# Patient Record
Sex: Female | Born: 2000 | Race: White | Hispanic: No | Marital: Single | Smoking: Never smoker
Health system: Southern US, Community
[De-identification: ages and names within clinical notes are randomized; demographics above are authoritative.]

## PROBLEM LIST (undated history)

## (undated) DIAGNOSIS — G43909 Migraine, unspecified, not intractable, without status migrainosus: Secondary | ICD-10-CM

## (undated) DIAGNOSIS — L709 Acne, unspecified: Secondary | ICD-10-CM

## (undated) DIAGNOSIS — S060X9A Concussion with loss of consciousness of unspecified duration, initial encounter: Secondary | ICD-10-CM

## (undated) DIAGNOSIS — J45909 Unspecified asthma, uncomplicated: Secondary | ICD-10-CM

## (undated) HISTORY — DX: Acne, unspecified: L70.9

## (undated) HISTORY — DX: Migraine, unspecified, not intractable, without status migrainosus: G43.909

## (undated) HISTORY — DX: Unspecified asthma, uncomplicated: J45.909

## (undated) HISTORY — DX: Concussion with loss of consciousness of unspecified duration, initial encounter: S06.0X9A

---
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
## 2018-02-12 ENCOUNTER — Ambulatory Visit: Payer: 59 | Admitting: Family Medicine

## 2018-02-14 ENCOUNTER — Ambulatory Visit: Payer: 59 | Admitting: Family Medicine

## 2018-07-01 ENCOUNTER — Encounter: Payer: Self-pay | Admitting: Family Medicine

## 2018-07-01 ENCOUNTER — Other Ambulatory Visit: Payer: Self-pay | Admitting: Family Medicine

## 2018-07-01 MED ORDER — NORGESTIM-ETH ESTRAD TRIPHASIC 0.18/0.215/0.25 MG-35 MCG PO TABS: 1.0000 | ORAL_TABLET | Freq: Every day | ORAL | 3 refills | Status: DC

## 2018-10-23 ENCOUNTER — Encounter: Payer: Self-pay | Admitting: Family Medicine

## 2018-10-23 ENCOUNTER — Other Ambulatory Visit: Payer: Self-pay | Admitting: Family Medicine

## 2018-10-23 NOTE — Telephone Encounter (Signed)
Requested medication (s) are due for refill today: Yes  Requested medication (s) are on the active medication list: Yes  Last refill:  07/01/18  Future visit scheduled: No  Notes to clinic:  Left message for pt. To call and make an appointment.    Requested Prescriptions  Pending Prescriptions Disp Refills   TRI-SPRINTEC 0.18/0.215/0.25 MG-35 MCG tablet [Pharmacy Med Name: Tri-Sprintec 0.18/0.215/0.25 MG-35 MCG Oral Tablet] 1 Package 0    Sig: Take 1 tablet by mouth once daily     OB/GYN:  Contraceptives Failed - 10/23/2018  9:17 AM      Failed - Valid encounter within last 12 months    Recent Outpatient Visits          1 year ago Routine general medical examination at a health care facility   [facility], [place] P, DO   1 year ago Moderate single current episode of major depressive disorder [facility])   [facility] Johnson, Megan P, DO   1 year ago Moderate single current episode of major depressive disorder [facility])   [facility] [place], [place], DO   1 year ago Itching   [facility] [place], [place], DO   1 year ago Urticaria   [facility] [place], [place]             Passed - Last BP in normal range    BP Readings from Last 1 Encounters:  09/17/17 111/75

## 2018-10-24 ENCOUNTER — Other Ambulatory Visit: Payer: Self-pay

## 2018-10-24 ENCOUNTER — Encounter: Payer: Self-pay | Admitting: Family Medicine

## 2018-10-24 ENCOUNTER — Ambulatory Visit (INDEPENDENT_AMBULATORY_CARE_PROVIDER_SITE_OTHER): Payer: BC Managed Care – PPO | Admitting: Family Medicine

## 2018-10-24 VITALS — BP 120/60 | Temp 97.6°F | Wt 153.2 lb

## 2018-10-24 DIAGNOSIS — F321 Major depressive disorder, single episode, moderate: Secondary | ICD-10-CM | POA: Diagnosis not present

## 2018-10-24 DIAGNOSIS — Z3041 Encounter for surveillance of contraceptive pills: Secondary | ICD-10-CM

## 2018-10-24 DIAGNOSIS — G43719 Chronic migraine without aura, intractable, without status migrainosus: Secondary | ICD-10-CM | POA: Diagnosis not present

## 2018-10-24 MED ORDER — NORGESTIM-ETH ESTRAD TRIPHASIC 0.18/0.215/0.25 MG-35 MCG PO TABS: 1.0000 | ORAL_TABLET | Freq: Every day | ORAL | 3 refills | Status: DC

## 2018-10-24 MED ORDER — NORTRIPTYLINE HCL 75 MG PO CAPS: 75.0000 mg | ORAL_CAPSULE | Freq: Every day | ORAL | 3 refills | Status: DC

## 2018-10-24 NOTE — Assessment & Plan Note (Signed)
Will restart her nortriptyline. Call with any concerns. Continue to monitor. Refills given today.

## 2018-10-24 NOTE — Assessment & Plan Note (Signed)
Stable off medicine. Doing counseling. Continue counseling. Continue to monitor.

## 2018-10-24 NOTE — Progress Notes (Signed)
BP 120/60   Temp 97.6 F (36.4 C)   Wt 153 lb 4 oz (69.5 kg)   BMI 22.63 kg/m    Subjective:    Patient ID: Morgan Bush, female    DOB: 04-16-2001, 18 y.o.   MRN: [id number]  HPI: Morgan Bush is a 18 y.o. female  Chief Complaint  Patient presents with  . Contraception   Has been doing better. Mental health is doing better. Has come off her lexapro. Was doing counseling. Has been doing a lot better. Does not want to get back on her medicine. Doing well without it Depression screen [facility] 2/9 10/24/2018 09/17/2017 06/01/2017 04/16/2017 07/06/2016  Decreased Interest 0 2 2 1 1   Down, Depressed, Hopeless 1 2 3 2 2   PHQ - 2 Score 1 4 5 3 3   Altered sleeping 0 1 0 3 3  Tired, decreased energy 3 1 0 2 2  Change in appetite 0 1 0 1 2  Feeling bad or failure about yourself  0 2 2 2 1   Trouble concentrating 3 3 2 3 1   Moving slowly or fidgety/restless 3 1 2 1 1   Suicidal thoughts 0 1 2 1 1   PHQ-9 Score 10 14 13 16 14   Difficult doing work/chores Very difficult - - Very difficult -   CONTRACEPTION CONCERNS Contraception: OCP Previous contraception: OCP  Sexual activity: practicing safe sex Average interval between menses: 28 days  Length of menses:  5-7 days Flow: moderate Dysmenorrhea: yes  Has been having more headaches than usual right now. She associates this with stress. Has not been taking her nortriptyline. Would like to go back on it. No other concerns or complaints at this time.  Relevant past medical, surgical, family and social history reviewed and updated as indicated. Interim medical history since our last visit reviewed. Allergies and medications reviewed and updated.  Review of Systems  Constitutional: Negative.   Respiratory: Negative.   Cardiovascular: Negative.   Musculoskeletal: Negative.   Neurological: Positive for headaches. Negative for dizziness, tremors, seizures, syncope, facial asymmetry, speech difficulty, weakness, light-headedness and  numbness.  Hematological: Negative.   Psychiatric/Behavioral: Negative.     Per HPI unless specifically indicated above     Objective:    BP 120/60   Temp 97.6 F (36.4 C)   Wt 153 lb 4 oz (69.5 kg)   BMI 22.63 kg/m   Wt Readings from Last 3 Encounters:  10/24/18 153 lb 4 oz (69.5 kg)  09/17/17 160 lb (72.6 kg) (88 %, Z= 1.18)*  06/01/17 157 lb 6 oz (71.4 kg) (87 %, Z= 1.14)*   * Growth percentiles are based on CDC (Girls, 2-20 Years) data.    Physical Exam Vitals signs and nursing note reviewed.  Constitutional:      General: She is not in acute distress.    Appearance: Normal appearance. She is not ill-appearing, toxic-appearing or diaphoretic.  HENT:     Head: Normocephalic and atraumatic.     Right Ear: External ear normal.     Left Ear: External ear normal.     Nose: Nose normal.     Mouth/Throat:     Mouth: Mucous membranes are moist.     Pharynx: Oropharynx is clear.  Eyes:     General: No scleral icterus.       Right eye: No discharge.        Left eye: No discharge.     Conjunctiva/sclera: Conjunctivae normal.     Pupils: Pupils  are equal, round, and reactive to light.  Neck:     Musculoskeletal: Normal range of motion.  Pulmonary:     Effort: Pulmonary effort is normal. No respiratory distress.     Comments: Speaking in full sentences Musculoskeletal: Normal range of motion.  Skin:    Coloration: Skin is not jaundiced or pale.     Findings: No bruising, erythema, lesion or rash.  Neurological:     Mental Status: She is alert and oriented to person, place, and time. Mental status is at baseline.  Psychiatric:        Mood and Affect: Mood normal.        Behavior: Behavior normal.        Thought Content: Thought content normal.        Judgment: Judgment normal.     Results for orders placed or performed in visit on 09/17/17  GC/Chlamydia Probe Amp  Result Value Ref Range   Chlamydia trachomatis, NAA Negative Negative   Neisseria gonorrhoeae by  PCR Negative Negative  Microscopic Examination  Result Value Ref Range   WBC, UA 6-10 (A) 0 - 5 /hpf   RBC, UA 0-2 0 - 2 /hpf   Epithelial Cells (non renal) 0-10 0 - 10 /hpf   Renal Epithel, UA 0-10 (A) None seen /hpf   Bacteria, UA Moderate (A) None seen/Few  Urine Culture, Reflex  Result Value Ref Range   Urine Culture, Routine Final report (A)    Organism ID, Bacteria Escherichia coli (A)    Antimicrobial Susceptibility Comment   CBC with Differential/Platelet  Result Value Ref Range   WBC 4.5 3.4 - 10.8 x10E3/uL   RBC 4.46 3.77 - 5.28 x10E6/uL   Hemoglobin 13.5 11.1 - 15.9 g/dL   Hematocrit 40.9 81.1 - 46.6 %   MCV 85 79 - 97 fL   MCH 30.3 26.6 - 33.0 pg   MCHC 35.5 31.5 - 35.7 g/dL   RDW 91.4 78.2 - 95.6 %   Platelets 271 150 - 379 x10E3/uL   Neutrophils 42 Not Estab. %   Lymphs 44 Not Estab. %   Monocytes 11 Not Estab. %   Eos 2 Not Estab. %   Basos 1 Not Estab. %   Neutrophils Absolute 1.9 1.4 - 7.0 x10E3/uL   Lymphocytes Absolute 2.0 0.7 - 3.1 x10E3/uL   Monocytes Absolute 0.5 0.1 - 0.9 x10E3/uL   EOS (ABSOLUTE) 0.1 0.0 - 0.4 x10E3/uL   Basophils Absolute 0.0 0.0 - 0.2 x10E3/uL   Immature Granulocytes 0 Not Estab. %   Immature Grans (Abs) 0.0 0.0 - 0.1 x10E3/uL  Comprehensive metabolic panel  Result Value Ref Range   Glucose 77 65 - 99 mg/dL   BUN 9 6 - 20 mg/dL   Creatinine, Ser 2.13 0.57 - 1.00 mg/dL   GFR calc non Af Amer 124 >59 mL/min/1.73   GFR calc Af Amer 143 >59 mL/min/1.73   BUN/Creatinine Ratio 13 9 - 23   Sodium 140 134 - 144 mmol/L   Potassium 4.1 3.5 - 5.2 mmol/L   Chloride 103 96 - 106 mmol/L   CO2 21 20 - 29 mmol/L   Calcium 8.8 8.7 - 10.2 mg/dL   Total Protein 6.8 6.0 - 8.5 g/dL   Albumin 4.1 3.5 - 5.5 g/dL   Globulin, Total 2.7 1.5 - 4.5 g/dL   Albumin/Globulin Ratio 1.5 1.2 - 2.2   Bilirubin Total 0.3 0.0 - 1.2 mg/dL   Alkaline Phosphatase 51 39 - 117 IU/L  AST 16 0 - 40 IU/L   ALT 22 0 - 32 IU/L  Lipid Panel w/o Chol/HDL Ratio   Result Value Ref Range   Cholesterol, Total 181 (H) 100 - 169 mg/dL   Triglycerides 454123 (H) 0 - 89 mg/dL   HDL 70 >09>39 mg/dL   VLDL Cholesterol Cal 25 5 - 40 mg/dL   LDL Calculated 86 0 - 109 mg/dL  TSH  Result Value Ref Range   TSH 5.270 (H) 0.450 - 4.500 uIU/mL  UA/M w/rflx Culture, Routine  Result Value Ref Range   Specific Gravity, UA 1.020 1.005 - 1.030   pH, UA 6.0 5.0 - 7.5   Color, UA Yellow Yellow   Appearance Ur Turbid (A) Clear   Leukocytes, UA Trace (A) Negative   Protein, UA Negative Negative/Trace   Glucose, UA Negative Negative   Ketones, UA Negative Negative   RBC, UA 3+ (A) Negative   Bilirubin, UA Negative Negative   Urobilinogen, Ur 0.2 0.2 - 1.0 mg/dL   Nitrite, UA Negative Negative   Microscopic Examination See below:    Urinalysis Reflex Comment   RPR  Result Value Ref Range   RPR Ser Ql Non Reactive Non Reactive  HIV antibody  Result Value Ref Range   HIV Screen 4th Generation wRfx Non Reactive Non Reactive  Hepatitis, Acute  Result Value Ref Range   Hep A IgM Negative Negative   Hepatitis B Surface Ag Negative Negative   Hep B C IgM Negative Negative   Hep C Virus Ab <0.1 0.0 - 0.9 s/co ratio  HSV(herpes simplex vrs) 1+2 ab-IgG  Result Value Ref Range   HSV 1 Glycoprotein G Ab, IgG <0.91 0.00 - 0.90 index   HSV 2 IgG, Type Spec <0.91 0.00 - 0.90 index      Assessment & Plan:   Problem List Items Addressed This Visit      Cardiovascular and Mediastinum   Intractable chronic migraine without aura and without status migrainosus    Will restart her nortriptyline. Call with any concerns. Continue to monitor. Refills given today.      Relevant Medications   nortriptyline (PAMELOR) 75 MG capsule     Other   Moderate single current episode of major depressive disorder (HCC) - Primary    Stable off medicine. Doing counseling. Continue counseling. Continue to monitor.       Relevant Medications   nortriptyline (PAMELOR) 75 MG capsule     Other Visit Diagnoses    Encounter for surveillance of contraceptive pills       Stable. Will continue current regimen. Continue to monitor. Call with any concerns.        Follow up plan: Return End of July, Beginning of August, for Physical.    . This visit was completed via Skype due to the restrictions of the COVID-19 pandemic. All issues as above were discussed and addressed. Physical exam was done as above through visual confirmation on Skype. If it was felt that the patient should be evaluated in the office, they were directed there. The patient verbally consented to this visit. . Location of the patient: home . Location of the provider: home . Those involved with this call:  . Provider: Olevia PerchesMegan Mega Kinkade, DO . CMA: Tiffany Reel, CMA . Front Desk/Registration: Adela Portshristan Williamson  . Time spent on call: 25 minutes with patient face to face via video conference. More than 50% of this time was spent in counseling and coordination of care. 40 minutes total spent  in review of patient's record and preparation of their chart.

## 2019-01-23 ENCOUNTER — Encounter: Payer: Self-pay | Admitting: Family Medicine

## 2019-01-23 ENCOUNTER — Other Ambulatory Visit: Payer: Self-pay

## 2019-01-23 ENCOUNTER — Ambulatory Visit (INDEPENDENT_AMBULATORY_CARE_PROVIDER_SITE_OTHER): Payer: BC Managed Care – PPO | Admitting: Family Medicine

## 2019-01-23 VITALS — BP 117/79 | HR 69 | Temp 98.4°F | Ht 68.5 in | Wt 151.0 lb

## 2019-01-23 DIAGNOSIS — Z0000 Encounter for general adult medical examination without abnormal findings: Secondary | ICD-10-CM | POA: Diagnosis not present

## 2019-01-23 NOTE — Patient Instructions (Signed)
Health Maintenance, Female Adopting a healthy lifestyle and getting preventive care are important in promoting health and wellness. Ask your health care provider about:  The right schedule for you to have regular tests and exams.  Things you can do on your own to prevent diseases and keep yourself healthy. What should I know about diet, weight, and exercise? Eat a healthy diet   Eat a diet that includes plenty of vegetables, fruits, low-fat dairy products, and lean protein.  Do not eat a lot of foods that are high in solid fats, added sugars, or sodium. Maintain a healthy weight Body mass index (BMI) is used to identify weight problems. It estimates body fat based on height and weight. Your health care provider can help determine your BMI and help you achieve or maintain a healthy weight. Get regular exercise Get regular exercise. This is one of the most important things you can do for your health. Most adults should:  Exercise for at least 150 minutes each week. The exercise should increase your heart rate and make you sweat (moderate-intensity exercise).  Do strengthening exercises at least twice a week. This is in addition to the moderate-intensity exercise.  Spend less time sitting. Even light physical activity can be beneficial. Watch cholesterol and blood lipids Have your blood tested for lipids and cholesterol at 20 years of age, then have this test every 5 years. Have your cholesterol levels checked more often if:  Your lipid or cholesterol levels are high.  You are older than 20 years of age.  You are at high risk for heart disease. What should I know about cancer screening? Depending on your health history and family history, you may need to have cancer screening at various ages. This may include screening for:  Breast cancer.  Cervical cancer.  Colorectal cancer.  Skin cancer.  Lung cancer. What should I know about heart disease, diabetes, and high blood  pressure? Blood pressure and heart disease  High blood pressure causes heart disease and increases the risk of stroke. This is more likely to develop in people who have high blood pressure readings, are of African descent, or are overweight.  Have your blood pressure checked: ? Every 3-5 years if you are 18-39 years of age. ? Every year if you are 40 years old or older. Diabetes Have regular diabetes screenings. This checks your fasting blood sugar level. Have the screening done:  Once every three years after age 40 if you are at a normal weight and have a low risk for diabetes.  More often and at a younger age if you are overweight or have a high risk for diabetes. What should I know about preventing infection? Hepatitis B If you have a higher risk for hepatitis B, you should be screened for this virus. Talk with your health care provider to find out if you are at risk for hepatitis B infection. Hepatitis C Testing is recommended for:  Everyone born from 1945 through 1965.  Anyone with known risk factors for hepatitis C. Sexually transmitted infections (STIs)  Get screened for STIs, including gonorrhea and chlamydia, if: ? You are sexually active and are younger than 20 years of age. ? You are older than 20 years of age and your health care provider tells you that you are at risk for this type of infection. ? Your sexual activity has changed since you were last screened, and you are at increased risk for chlamydia or gonorrhea. Ask your health care provider if   you are at risk.  Ask your health care provider about whether you are at high risk for HIV. Your health care provider may recommend a prescription medicine to help prevent HIV infection. If you choose to take medicine to prevent HIV, you should first get tested for HIV. You should then be tested every 3 months for as long as you are taking the medicine. Pregnancy  If you are about to stop having your period (premenopausal) and  you may become pregnant, seek counseling before you get pregnant.  Take 400 to 800 micrograms (mcg) of folic acid every day if you become pregnant.  Ask for birth control (contraception) if you want to prevent pregnancy. Osteoporosis and menopause Osteoporosis is a disease in which the bones lose minerals and strength with aging. This can result in bone fractures. If you are 65 years old or older, or if you are at risk for osteoporosis and fractures, ask your health care provider if you should:  Be screened for bone loss.  Take a calcium or vitamin D supplement to lower your risk of fractures.  Be given hormone replacement therapy (HRT) to treat symptoms of menopause. Follow these instructions at home: Lifestyle  Do not use any products that contain nicotine or tobacco, such as cigarettes, e-cigarettes, and chewing tobacco. If you need help quitting, ask your health care provider.  Do not use street drugs.  Do not share needles.  Ask your health care provider for help if you need support or information about quitting drugs. Alcohol use  Do not drink alcohol if: ? Your health care provider tells you not to drink. ? You are pregnant, may be pregnant, or are planning to become pregnant.  If you drink alcohol: ? Limit how much you use to 0-1 drink a day. ? Limit intake if you are breastfeeding.  Be aware of how much alcohol is in your drink. In the [place] one drink equals one 12 oz bottle of beer (355 mL), one 5 oz glass of wine (148 mL), or one 1 oz glass of hard liquor (44 mL). General instructions  Schedule regular health, dental, and eye exams.  Stay current with your vaccines.  Tell your health care provider if: ? You often feel depressed. ? You have ever been abused or do not feel safe at home. Summary  Adopting a healthy lifestyle and getting preventive care are important in promoting health and wellness.  Follow your health care provider's instructions about healthy  diet, exercising, and getting tested or screened for diseases.  Follow your health care provider's instructions on monitoring your cholesterol and blood pressure. This information is not intended to replace advice given to you by your health care provider. Make sure you discuss any questions you have with your health care provider. Document Released: 01/09/2011 Document Revised: 06/19/2018 Document Reviewed: 06/19/2018 [employer] Patient Education  2020 [employer]  

## 2019-01-23 NOTE — Progress Notes (Addendum)
BP 117/79   Pulse 69   Temp 98.4 F (36.9 C) (Oral)   Ht 5' 8.5" (1.74 m)   Wt 151 lb (68.5 kg)   SpO2 99%   BMI 22.63 kg/m    Subjective:    Patient ID: Morgan Bush, female    DOB: 07-31-2000, 18 y.o.   MRN: [id number]  HPI: Morgan Loserisha R Simone is a 18 y.o. female presenting on 01/23/2019 for comprehensive medical examination. Current medical complaints include:  Has not been taking her nortriptyline as she didn't feel like it was helping, but it seems like she is not having a bad headaches.   Menopausal Symptoms: no  Depression Screen done today and results listed below:  [facility] 01/23/2019 10/24/2018 09/17/2017 06/01/2017 04/16/2017  Decreased Interest 0 0 2 2 1   Down, Depressed, Hopeless 0 1 2 3 2   PHQ - 2 Score 0 1 4 5 3   Altered sleeping 3 0 1 0 3  Tired, decreased energy 3 3 1  0 2  Change in appetite 3 0 1 0 1  Feeling bad or failure about yourself  1 0 2 2 2   Trouble concentrating 2 3 3 2 3   Moving slowly or fidgety/restless 0 3 1 2 1   Suicidal thoughts 0 0 1 2 1   PHQ-9 Score 12 10 14 13 16   Difficult doing work/chores Not difficult at all Very difficult - - Very difficult  Some recent data might be hidden   GAD 7 : Generalized Anxiety Score 01/23/2019 10/24/2018 09/17/2017 04/16/2017  Nervous, Anxious, on Edge 2 3 3 2   Control/stop worrying 1 3 3 2   Worry too much - different things 1 3 3 2   Trouble relaxing 3 3 3 3   Restless 3 3 2 2   Easily annoyed or irritable 3 3 3 2   Afraid - awful might happen 2 2 1 1   Total GAD 7 Score 15 20 18 14   Anxiety Difficulty Somewhat difficult Very difficult - Somewhat difficult     Past Medical History:  Past Medical History:  Diagnosis Date  . Acne   . Asthma   . Concussion 07/2013  . Migraine   . Migraine headache     Surgical History:  History reviewed. No pertinent surgical history.  Medications:  Current Outpatient Medications on File Prior to Visit  Medication Sig  . baclofen (LIORESAL) 10  MG tablet Take 10 mg by mouth 3 (three) times daily.  . Norgestimate-Ethinyl Estradiol Triphasic (TRI-SPRINTEC) 0.18/0.215/0.25 MG-35 MCG tablet Take 1 tablet by mouth daily.   No current facility-administered medications on file prior to visit.     Allergies:  No Known Allergies  Social History:  Social History   Socioeconomic History  . Marital status: Single    Spouse name: Not on file  . Number of children: Not on file  . Years of education: Not on file  . Highest education level: Not on file  Occupational History  . Not on file  Social Needs  . Financial resource strain: Not on file  . Food insecurity    Worry: Not on file    Inability: Not on file  . Transportation needs    Medical: Not on file    Non-medical: Not on file  Tobacco Use  . Smoking status: Never Smoker  . Smokeless tobacco: Never Used  Substance and Sexual Activity  . Alcohol use: No  . Drug use: No  . Sexual activity: Never    Birth control/protection:  Pill  Lifestyle  . Physical activity    Days per week: Not on file    Minutes per session: Not on file  . Stress: Not on file  Relationships  . Social Musicianconnections    Talks on phone: Not on file    Gets together: Not on file    Attends religious service: Not on file    Active member of club or organization: Not on file    Attends meetings of clubs or organizations: Not on file    Relationship status: Not on file  . Intimate partner violence    Fear of current or ex partner: Not on file    Emotionally abused: Not on file    Physically abused: Not on file    Forced sexual activity: Not on file  Other Topics Concern  . Not on file  Social History Narrative  . Not on file   Social History   Tobacco Use  Smoking Status Never Smoker  Smokeless Tobacco Never Used   Social History   Substance and Sexual Activity  Alcohol Use No    Family History:  Family History  Problem Relation Age of Onset  . Mental illness Mother        Bipolar   . Migraines Mother   . Kidney Stones Father     Past medical history, surgical history, medications, allergies, family history and social history reviewed with patient today and changes made to appropriate areas of the chart.   Review of Systems  Constitutional: Negative.   HENT: Negative.   Eyes: Negative.   Respiratory: Negative.   Cardiovascular: Positive for chest pain (with anxiety). Negative for palpitations, orthopnea, claudication, leg swelling and PND.  Gastrointestinal: Positive for constipation and nausea. Negative for abdominal pain, blood in stool, diarrhea, heartburn, melena and vomiting.  Genitourinary: Negative.   Musculoskeletal: Negative.   Skin: Negative.   Neurological: Negative.   Endo/Heme/Allergies: Positive for polydipsia. Negative for environmental allergies. Does not bruise/bleed easily.  Psychiatric/Behavioral: Negative for depression, hallucinations, memory loss, substance abuse and suicidal ideas. The patient is nervous/anxious. The patient does not have insomnia.     All other ROS negative except what is listed above and in the HPI.      Objective:    BP 117/79   Pulse 69   Temp 98.4 F (36.9 C) (Oral)   Ht 5' 8.5" (1.74 m)   Wt 151 lb (68.5 kg)   SpO2 99%   BMI 22.63 kg/m   Wt Readings from Last 3 Encounters:  01/23/19 151 lb (68.5 kg)  10/24/18 153 lb 4 oz (69.5 kg)  09/17/17 160 lb (72.6 kg) (88 %, Z= 1.18)*   * Growth percentiles are based on CDC (Girls, 2-20 Years) data.    Physical Exam Vitals signs and nursing note reviewed.  Constitutional:      General: She is not in acute distress.    Appearance: Normal appearance. She is not ill-appearing, toxic-appearing or diaphoretic.  HENT:     Head: Normocephalic and atraumatic.     Right Ear: Tympanic membrane, ear canal and external ear normal. There is no impacted cerumen.     Left Ear: Tympanic membrane, ear canal and external ear normal. There is no impacted cerumen.     Nose: Nose  normal. No congestion or rhinorrhea.     Mouth/Throat:     Mouth: Mucous membranes are moist.     Pharynx: Oropharynx is clear. No oropharyngeal exudate or posterior oropharyngeal erythema.  Eyes:  General: No scleral icterus.       Right eye: No discharge.        Left eye: No discharge.     Extraocular Movements: Extraocular movements intact.     Conjunctiva/sclera: Conjunctivae normal.     Pupils: Pupils are equal, round, and reactive to light.  Neck:     Musculoskeletal: Normal range of motion and neck supple. No neck rigidity or muscular tenderness.     Vascular: No carotid bruit.  Cardiovascular:     Rate and Rhythm: Normal rate and regular rhythm.     Pulses: Normal pulses.     Heart sounds: No murmur. No friction rub. No gallop.   Pulmonary:     Effort: Pulmonary effort is normal. No respiratory distress.     Breath sounds: Normal breath sounds. No stridor. No wheezing, rhonchi or rales.  Chest:     Chest wall: No tenderness.  Abdominal:     General: Abdomen is flat. Bowel sounds are normal. There is no distension.     Palpations: Abdomen is soft. There is no mass.     Tenderness: There is no abdominal tenderness. There is no right CVA tenderness, left CVA tenderness, guarding or rebound.     Hernia: No hernia is present.  Genitourinary:    Comments: Breast and pelvic exams deferred with shared decision making Musculoskeletal:        General: No swelling, tenderness, deformity or signs of injury.     Right lower leg: No edema.     Left lower leg: No edema.  Lymphadenopathy:     Cervical: No cervical adenopathy.  Skin:    General: Skin is warm and dry.     Capillary Refill: Capillary refill takes less than 2 seconds.     Coloration: Skin is not jaundiced or pale.     Findings: No bruising, erythema, lesion or rash.  Neurological:     General: No focal deficit present.     Mental Status: She is alert and oriented to person, place, and time. Mental status is at  baseline.     Cranial Nerves: No cranial nerve deficit.     Sensory: No sensory deficit.     Motor: No weakness.     Coordination: Coordination normal.     Gait: Gait normal.     Deep Tendon Reflexes: Reflexes normal.  Psychiatric:        Mood and Affect: Mood normal.        Behavior: Behavior normal.        Thought Content: Thought content normal.        Judgment: Judgment normal.     Results for orders placed or performed in visit on 09/17/17  GC/Chlamydia Probe Amp   Specimen: Urine   UR  Result Value Ref Range   Chlamydia trachomatis, NAA Negative Negative   Neisseria gonorrhoeae by PCR Negative Negative  Microscopic Examination   URINE  Result Value Ref Range   WBC, UA 6-10 (A) 0 - 5 /hpf   RBC, UA 0-2 0 - 2 /hpf   Epithelial Cells (non renal) 0-10 0 - 10 /hpf   Renal Epithel, UA 0-10 (A) None seen /hpf   Bacteria, UA Moderate (A) None seen/Few  Urine Culture, Reflex   URINE  Result Value Ref Range   Urine Culture, Routine Final report (A)    Organism ID, Bacteria Escherichia coli (A)    Antimicrobial Susceptibility Comment   CBC with Differential/Platelet  Result Value Ref Range  WBC 4.5 3.4 - 10.8 x10E3/uL   RBC 4.46 3.77 - 5.28 x10E6/uL   Hemoglobin 13.5 11.1 - 15.9 g/dL   Hematocrit 16.1 09.6 - 46.6 %   MCV 85 79 - 97 fL   MCH 30.3 26.6 - 33.0 pg   MCHC 35.5 31.5 - 35.7 g/dL   RDW 04.5 40.9 - 81.1 %   Platelets 271 150 - 379 x10E3/uL   Neutrophils 42 Not Estab. %   Lymphs 44 Not Estab. %   Monocytes 11 Not Estab. %   Eos 2 Not Estab. %   Basos 1 Not Estab. %   Neutrophils Absolute 1.9 1.4 - 7.0 x10E3/uL   Lymphocytes Absolute 2.0 0.7 - 3.1 x10E3/uL   Monocytes Absolute 0.5 0.1 - 0.9 x10E3/uL   EOS (ABSOLUTE) 0.1 0.0 - 0.4 x10E3/uL   Basophils Absolute 0.0 0.0 - 0.2 x10E3/uL   Immature Granulocytes 0 Not Estab. %   Immature Grans (Abs) 0.0 0.0 - 0.1 x10E3/uL  Comprehensive metabolic panel  Result Value Ref Range   Glucose 77 65 - 99 mg/dL   BUN 9  6 - 20 mg/dL   Creatinine, Ser 9.14 0.57 - 1.00 mg/dL   GFR calc non Af Amer 124 >59 mL/min/1.73   GFR calc Af Amer 143 >59 mL/min/1.73   BUN/Creatinine Ratio 13 9 - 23   Sodium 140 134 - 144 mmol/L   Potassium 4.1 3.5 - 5.2 mmol/L   Chloride 103 96 - 106 mmol/L   CO2 21 20 - 29 mmol/L   Calcium 8.8 8.7 - 10.2 mg/dL   Total Protein 6.8 6.0 - 8.5 g/dL   Albumin 4.1 3.5 - 5.5 g/dL   Globulin, Total 2.7 1.5 - 4.5 g/dL   Albumin/Globulin Ratio 1.5 1.2 - 2.2   Bilirubin Total 0.3 0.0 - 1.2 mg/dL   Alkaline Phosphatase 51 39 - 117 IU/L   AST 16 0 - 40 IU/L   ALT 22 0 - 32 IU/L  Lipid Panel w/o Chol/HDL Ratio  Result Value Ref Range   Cholesterol, Total 181 (H) 100 - 169 mg/dL   Triglycerides 782 (H) 0 - 89 mg/dL   HDL 70 >95 mg/dL   VLDL Cholesterol Cal 25 5 - 40 mg/dL   LDL Calculated 86 0 - 109 mg/dL  TSH  Result Value Ref Range   TSH 5.270 (H) 0.450 - 4.500 uIU/mL  UA/M w/rflx Culture, Routine   Specimen: Urine   URINE  Result Value Ref Range   Specific Gravity, UA 1.020 1.005 - 1.030   pH, UA 6.0 5.0 - 7.5   Color, UA Yellow Yellow   Appearance Ur Turbid (A) Clear   Leukocytes, UA Trace (A) Negative   Protein, UA Negative Negative/Trace   Glucose, UA Negative Negative   Ketones, UA Negative Negative   RBC, UA 3+ (A) Negative   Bilirubin, UA Negative Negative   Urobilinogen, Ur 0.2 0.2 - 1.0 mg/dL   Nitrite, UA Negative Negative   Microscopic Examination See below:    Urinalysis Reflex Comment   RPR  Result Value Ref Range   RPR Ser Ql Non Reactive Non Reactive  HIV antibody  Result Value Ref Range   HIV Screen 4th Generation wRfx Non Reactive Non Reactive  Hepatitis, Acute  Result Value Ref Range   Hep A IgM Negative Negative   Hepatitis B Surface Ag Negative Negative   Hep B C IgM Negative Negative   Hep C Virus Ab <0.1 0.0 - 0.9 s/co ratio  HSV(herpes simplex vrs) 1+2 ab-IgG  Result Value Ref Range   HSV 1 Glycoprotein G Ab, IgG <0.91 0.00 - 0.90 index    HSV 2 IgG, Type Spec <0.91 0.00 - 0.90 index      Assessment & Plan:   Problem List Items Addressed This Visit    None    Visit Diagnoses    Routine general medical examination at a health care facility    -  Primary   Vaccines up to date. Screening labs checked today. Continue diet and exercise. Call with any concerns.    Relevant Orders   CBC with Differential/Platelet   Comprehensive metabolic panel   Lipid Panel w/o Chol/HDL Ratio   TSH   UA/M w/rflx Culture, Routine   GC/Chlamydia Probe Amp       Follow up plan: Return in about 1 year (around 01/23/2020) for Physical.   LABORATORY TESTING:  - Pap smear: not applicable  IMMUNIZATIONS:   - Tdap: Tetanus vaccination status reviewed: last tetanus booster within 10 years. - Influenza: Postponed to flu season - Pneumovax: Not applicable  PATIENT COUNSELING:   Advised to take 1 mg of folate supplement per day if capable of pregnancy.   Sexuality: Discussed sexually transmitted diseases, partner selection, use of condoms, avoidance of unintended pregnancy  and contraceptive alternatives.   Advised to avoid cigarette smoking.  I discussed with the patient that most people either abstain from alcohol or drink within safe limits (<=14/week and <=4 drinks/occasion for males, <=7/weeks and <= 3 drinks/occasion for females) and that the risk for alcohol disorders and other health effects rises proportionally with the number of drinks per week and how often a drinker exceeds daily limits.  Discussed cessation/primary prevention of drug use and availability of treatment for abuse.   Diet: Encouraged to adjust caloric intake to maintain  or achieve ideal body weight, to reduce intake of dietary saturated fat and total fat, to limit sodium intake by avoiding high sodium foods and not adding table salt, and to maintain adequate dietary potassium and calcium preferably from fresh fruits, vegetables, and low-fat dairy products.     stressed the importance of regular exercise  Injury prevention: Discussed safety belts, safety helmets, smoke detector, smoking near bedding or upholstery.   Dental health: Discussed importance of regular tooth brushing, flossing, and dental visits.    NEXT PREVENTATIVE PHYSICAL DUE IN 1 YEAR. Return in about 1 year (around 01/23/2020) for Physical.

## 2019-01-24 LAB — LIPID PANEL W/O CHOL/HDL RATIO
Cholesterol, Total: 187 mg/dL (ref 100–199)
HDL: 72 mg/dL (ref >39)
LDL Calculated: 89 mg/dL (ref 0–99)
Triglycerides: 130 mg/dL (ref 0–149)
VLDL Cholesterol Cal: 26 mg/dL (ref 5–40)

## 2019-01-24 LAB — CBC WITH DIFFERENTIAL/PLATELET
Basophils Absolute: 0.1 10*3/uL (ref 0.0–0.2)
Basos: 2 %
EOS (ABSOLUTE): 0.1 10*3/uL (ref 0.0–0.4)
Eos: 3 %
Hematocrit: 37.7 % (ref 34.0–46.6)
Hemoglobin: 12.7 g/dL (ref 11.1–15.9)
Immature Grans (Abs): 0 10*3/uL (ref 0.0–0.1)
Immature Granulocytes: 0 %
Lymphocytes Absolute: 2.2 10*3/uL (ref 0.7–3.1)
Lymphs: 53 %
MCH: 29.2 pg (ref 26.6–33.0)
MCHC: 33.7 g/dL (ref 31.5–35.7)
MCV: 87 fL (ref 79–97)
Monocytes Absolute: 0.3 10*3/uL (ref 0.1–0.9)
Monocytes: 9 %
Neutrophils Absolute: 1.3 10*3/uL — ABNORMAL LOW (ref 1.4–7.0)
Neutrophils: 33 %
Platelets: 250 10*3/uL (ref 150–450)
RBC: 4.35 x10E6/uL (ref 3.77–5.28)
RDW: 12.8 % (ref 11.7–15.4)
WBC: 4 10*3/uL (ref 3.4–10.8)

## 2019-01-24 LAB — COMPREHENSIVE METABOLIC PANEL
ALT: 9 IU/L (ref 0–32)
AST: 15 IU/L (ref 0–40)
Albumin/Globulin Ratio: 1.6 (ref 1.2–2.2)
Albumin: 4.4 g/dL (ref 3.9–5.0)
Alkaline Phosphatase: 65 IU/L (ref 39–117)
BUN/Creatinine Ratio: 8 — ABNORMAL LOW (ref 9–23)
BUN: 7 mg/dL (ref 6–20)
Bilirubin Total: 0.5 mg/dL (ref 0.0–1.2)
CO2: 23 mmol/L (ref 20–29)
Calcium: 9.4 mg/dL (ref 8.7–10.2)
Chloride: 101 mmol/L (ref 96–106)
Creatinine, Ser: 0.9 mg/dL (ref 0.57–1.00)
GFR calc Af Amer: 106 mL/min/{1.73_m2} (ref >59)
GFR calc non Af Amer: 92 mL/min/{1.73_m2} (ref >59)
Globulin, Total: 2.7 g/dL (ref 1.5–4.5)
Glucose: 80 mg/dL (ref 65–99)
Potassium: 4 mmol/L (ref 3.5–5.2)
Sodium: 140 mmol/L (ref 134–144)
Total Protein: 7.1 g/dL (ref 6.0–8.5)

## 2019-01-24 LAB — TSH: TSH: 2.38 u[IU]/mL (ref 0.450–4.500)

## 2019-01-27 ENCOUNTER — Other Ambulatory Visit: Payer: Self-pay | Admitting: Family Medicine

## 2019-01-27 LAB — MICROSCOPIC EXAMINATION: RBC, Urine: NONE SEEN /hpf (ref 0–2)

## 2019-01-27 LAB — UA/M W/RFLX CULTURE, ROUTINE
Bilirubin, UA: NEGATIVE
Glucose, UA: NEGATIVE
Ketones, UA: NEGATIVE
Nitrite, UA: NEGATIVE
Protein,UA: NEGATIVE
RBC, UA: NEGATIVE
Specific Gravity, UA: 1.025 (ref 1.005–1.030)
Urobilinogen, Ur: 1 mg/dL (ref 0.2–1.0)
pH, UA: 5.5 (ref 5.0–7.5)

## 2019-01-27 LAB — URINE CULTURE, REFLEX

## 2019-01-27 MED ORDER — NITROFURANTOIN MONOHYD MACRO 100 MG PO CAPS: 100.0000 mg | ORAL_CAPSULE | Freq: Two times a day (BID) | ORAL | 0 refills | Status: DC

## 2019-01-28 ENCOUNTER — Encounter: Payer: Self-pay | Admitting: Family Medicine

## 2019-01-29 LAB — GC/CHLAMYDIA PROBE AMP
Chlamydia trachomatis, NAA: NEGATIVE
Neisseria Gonorrhoeae by PCR: NEGATIVE

## 2019-07-22 DIAGNOSIS — Z20822 Contact with and (suspected) exposure to covid-19: Secondary | ICD-10-CM | POA: Diagnosis not present

## 2019-08-11 ENCOUNTER — Encounter: Payer: Self-pay | Admitting: Family Medicine

## 2019-08-25 ENCOUNTER — Encounter: Payer: Self-pay | Admitting: Family Medicine

## 2019-08-25 ENCOUNTER — Ambulatory Visit (INDEPENDENT_AMBULATORY_CARE_PROVIDER_SITE_OTHER): Payer: BC Managed Care – PPO | Admitting: Family Medicine

## 2019-08-25 ENCOUNTER — Other Ambulatory Visit: Payer: Self-pay

## 2019-08-25 VITALS — BP 112/70 | HR 79 | Temp 98.1°F

## 2019-08-25 DIAGNOSIS — N898 Other specified noninflammatory disorders of vagina: Secondary | ICD-10-CM

## 2019-08-25 DIAGNOSIS — F321 Major depressive disorder, single episode, moderate: Secondary | ICD-10-CM | POA: Diagnosis not present

## 2019-08-25 MED ORDER — ESCITALOPRAM OXALATE 10 MG PO TABS: ORAL_TABLET | ORAL | 3 refills | Status: DC

## 2019-08-25 NOTE — Assessment & Plan Note (Signed)
Not doing well. Will restart lexapro and recheck 1 month. Call with any concerns. Continue to monitor.

## 2019-08-25 NOTE — Progress Notes (Signed)
BP 112/70 (BP Location: Left Arm, Patient Position: Sitting, Cuff Size: Normal)   Pulse 79   Temp 98.1 F (36.7 C) (Oral)   SpO2 100%    Subjective:    Patient ID: Morgan Bush, female    DOB: 2001/01/08, 19 y.o.   MRN: [id number]  HPI: Morgan Bush is a 19 y.o. female  Chief Complaint  Patient presents with  . Skin Tag   RASH- was having some lumps down in her groin and was concerned that they were skin tags. She was using different products around that time.  Duration:  couple of weeks  Location: groin and vaginal area  Itching: no Burning: no Redness: no Oozing: no Scaling: no Blisters: no Painful: no Fevers: no Change in detergents/soaps/personal care products: yes Recent illness: no Recent travel:no History of same: no Context: better Alleviating factors: nothing Treatments attempted:nothing Shortness of breath: no  Throat/tongue swelling: no Myalgias/arthralgias: no  DEPRESSION Mood status: uncontrolled Satisfied with current treatment?: no Symptom severity: moderate  Duration of current treatment : not on anything Side effects: no Previous psychiatric medications: fluoxetine, lexapro Depressed mood: yes Anxious mood: yes Anhedonia: no Significant weight loss or gain: no Insomnia: no  Fatigue: yes Feelings of worthlessness or guilt: yes Impaired concentration/indecisiveness: yes Suicidal ideations: yes Hopelessness: yes Crying spells: yes Depression screen [facility] 2/9 08/25/2019 01/23/2019 10/24/2018 09/17/2017 06/01/2017  Decreased Interest 3 0 0 2 2  Down, Depressed, Hopeless 3 0 1 2 3   PHQ - 2 Score 6 0 1 4 5   Altered sleeping 3 3 0 1 0  Tired, decreased energy 2 3 3 1  0  Change in appetite 3 3 0 1 0  Feeling bad or failure about yourself  3 1 0 2 2  Trouble concentrating 3 2 3 3 2   Moving slowly or fidgety/restless 2 0 3 1 2   Suicidal thoughts 2 0 0 1 2  PHQ-9 Score 24 12 10 14 13   Difficult doing work/chores Very difficult Not  difficult at all Very difficult - -  Some recent data might be hidden     Relevant past medical, surgical, family and social history reviewed and updated as indicated. Interim medical history since our last visit reviewed. Allergies and medications reviewed and updated.  Review of Systems  Constitutional: Negative.   Respiratory: Negative.   Cardiovascular: Negative.   Genitourinary: Negative.   Musculoskeletal: Negative.   Skin: Negative.   Psychiatric/Behavioral: Positive for decreased concentration and dysphoric mood. Negative for agitation, behavioral problems, confusion, hallucinations, self-injury, sleep disturbance and suicidal ideas. The patient is nervous/anxious. The patient is not hyperactive.     Per HPI unless specifically indicated above     Objective:    BP 112/70 (BP Location: Left Arm, Patient Position: Sitting, Cuff Size: Normal)   Pulse 79   Temp 98.1 F (36.7 C) (Oral)   SpO2 100%   Wt Readings from Last 3 Encounters:  01/23/19 151 lb (68.5 kg)  10/24/18 153 lb 4 oz (69.5 kg)  09/17/17 160 lb (72.6 kg) (88 %, Z= 1.18)*   * Growth percentiles are based on CDC (Girls, 2-20 Years) data.    Physical Exam Vitals and nursing note reviewed. Exam conducted with a chaperone present.  Constitutional:      General: She is not in acute distress.    Appearance: Normal appearance. She is not ill-appearing, toxic-appearing or diaphoretic.  HENT:     Head: Normocephalic and atraumatic.     Right Ear: External  ear normal.     Left Ear: External ear normal.     Nose: Nose normal.     Mouth/Throat:     Mouth: Mucous membranes are moist.     Pharynx: Oropharynx is clear.  Eyes:     General: No scleral icterus.       Right eye: No discharge.        Left eye: No discharge.     Extraocular Movements: Extraocular movements intact.     Conjunctiva/sclera: Conjunctivae normal.     Pupils: Pupils are equal, round, and reactive to light.  Cardiovascular:     Rate and  Rhythm: Normal rate and regular rhythm.     Pulses: Normal pulses.     Heart sounds: Normal heart sounds. No murmur. No friction rub. No gallop.   Pulmonary:     Effort: Pulmonary effort is normal. No respiratory distress.     Breath sounds: Normal breath sounds. No stridor. No wheezing, rhonchi or rales.  Chest:     Chest wall: No tenderness.  Abdominal:     Hernia: There is no hernia in the left inguinal area or right inguinal area.  Genitourinary:    General: Normal vulva.     Pubic Area: No rash or pubic lice.      Labia:        Right: No rash, tenderness, lesion or injury.        Left: No rash, tenderness, lesion or injury.      Urethra: No prolapse, urethral pain, urethral swelling or urethral lesion.  Musculoskeletal:        General: Normal range of motion.     Cervical back: Normal range of motion and neck supple.  Lymphadenopathy:     Lower Body: No right inguinal adenopathy. No left inguinal adenopathy.  Skin:    General: Skin is warm and dry.     Capillary Refill: Capillary refill takes less than 2 seconds.     Coloration: Skin is not jaundiced or pale.     Findings: No bruising, erythema, lesion or rash.  Neurological:     General: No focal deficit present.     Mental Status: She is alert and oriented to person, place, and time. Mental status is at baseline.  Psychiatric:        Mood and Affect: Mood normal.        Behavior: Behavior normal.        Thought Content: Thought content normal.        Judgment: Judgment normal.     Results for orders placed or performed in visit on 01/23/19  GC/Chlamydia Probe Amp   Specimen: Urine   UR  Result Value Ref Range   Chlamydia trachomatis, NAA Negative Negative   Neisseria Gonorrhoeae by PCR Negative Negative  Microscopic Examination   URINE  Result Value Ref Range   WBC, UA 0-5 0 - 5 /hpf   RBC None seen 0 - 2 /hpf   Epithelial Cells (non renal) 0-10 0 - 10 /hpf   Bacteria, UA Moderate (A) None seen/Few  Urine  Culture, Reflex   URINE  Result Value Ref Range   Urine Culture, Routine Final report (A)    Organism ID, Bacteria Escherichia coli (A)    Antimicrobial Susceptibility Comment   CBC with Differential/Platelet  Result Value Ref Range   WBC 4.0 3.4 - 10.8 x10E3/uL   RBC 4.35 3.77 - 5.28 x10E6/uL   Hemoglobin 12.7 11.1 - 15.9 g/dL   Hematocrit  37.7 34.0 - 46.6 %   MCV 87 79 - 97 fL   MCH 29.2 26.6 - 33.0 pg   MCHC 33.7 31.5 - 35.7 g/dL   RDW 69.7 94.8 - 01.6 %   Platelets 250 150 - 450 x10E3/uL   Neutrophils 33 Not Estab. %   Lymphs 53 Not Estab. %   Monocytes 9 Not Estab. %   Eos 3 Not Estab. %   Basos 2 Not Estab. %   Neutrophils Absolute 1.3 (L) 1.4 - 7.0 x10E3/uL   Lymphocytes Absolute 2.2 0.7 - 3.1 x10E3/uL   Monocytes Absolute 0.3 0.1 - 0.9 x10E3/uL   EOS (ABSOLUTE) 0.1 0.0 - 0.4 x10E3/uL   Basophils Absolute 0.1 0.0 - 0.2 x10E3/uL   Immature Granulocytes 0 Not Estab. %   Immature Grans (Abs) 0.0 0.0 - 0.1 x10E3/uL  Comprehensive metabolic panel  Result Value Ref Range   Glucose 80 65 - 99 mg/dL   BUN 7 6 - 20 mg/dL   Creatinine, Ser 5.53 0.57 - 1.00 mg/dL   GFR calc non Af Amer 92 >59 mL/min/1.73   GFR calc Af Amer 106 >59 mL/min/1.73   BUN/Creatinine Ratio 8 (L) 9 - 23   Sodium 140 134 - 144 mmol/L   Potassium 4.0 3.5 - 5.2 mmol/L   Chloride 101 96 - 106 mmol/L   CO2 23 20 - 29 mmol/L   Calcium 9.4 8.7 - 10.2 mg/dL   Total Protein 7.1 6.0 - 8.5 g/dL   Albumin 4.4 3.9 - 5.0 g/dL   Globulin, Total 2.7 1.5 - 4.5 g/dL   Albumin/Globulin Ratio 1.6 1.2 - 2.2   Bilirubin Total 0.5 0.0 - 1.2 mg/dL   Alkaline Phosphatase 65 39 - 117 IU/L   AST 15 0 - 40 IU/L   ALT 9 0 - 32 IU/L  Lipid Panel w/o Chol/HDL Ratio  Result Value Ref Range   Cholesterol, Total 187 100 - 199 mg/dL   Triglycerides 748 0 - 149 mg/dL   HDL 72 >27 mg/dL   VLDL Cholesterol Cal 26 5 - 40 mg/dL   LDL Calculated 89 0 - 99 mg/dL  TSH  Result Value Ref Range   TSH 2.380 0.450 - 4.500 uIU/mL    UA/M w/rflx Culture, Routine   Specimen: Urine   URINE  Result Value Ref Range   Specific Gravity, UA 1.025 1.005 - 1.030   pH, UA 5.5 5.0 - 7.5   Color, UA Yellow Yellow   Appearance Ur Cloudy (A) Clear   Leukocytes,UA Trace (A) Negative   Protein,UA Negative Negative/Trace   Glucose, UA Negative Negative   Ketones, UA Negative Negative   RBC, UA Negative Negative   Bilirubin, UA Negative Negative   Urobilinogen, Ur 1.0 0.2 - 1.0 mg/dL   Nitrite, UA Negative Negative   Microscopic Examination See below:    Urinalysis Reflex Comment       Assessment & Plan:   Problem List Items Addressed This Visit      Other   Moderate single current episode of major depressive disorder (HCC) - Primary    Not doing well. Will restart lexapro and recheck 1 month. Call with any concerns. Continue to monitor.       Relevant Medications   escitalopram (LEXAPRO) 10 MG tablet    Other Visit Diagnoses    Vaginal irritation       Normal exam, likely due to irritation from using different products. Continue to monitor. Call with any concerns.  Follow up plan: Return in about 4 weeks (around 09/22/2019) for virtual- mood.

## 2019-09-20 ENCOUNTER — Other Ambulatory Visit: Payer: Self-pay | Admitting: Family Medicine

## 2019-09-20 NOTE — Telephone Encounter (Signed)
Requested Prescriptions  Pending Prescriptions Disp Refills  . TRI-SPRINTEC 0.18/0.215/0.25 MG-35 MCG tablet [Pharmacy Med Name: Tri-Sprintec 0.18/0.215/0.25 MG-35 MCG Oral Tablet] 84 tablet 0    Sig: Take 1 tablet by mouth once daily     OB/GYN:  Contraceptives Passed - 09/20/2019 10:53 AM      Passed - Last BP in normal range    BP Readings from Last 1 Encounters:  08/25/19 112/70         Passed - Valid encounter within last 12 months    Recent Outpatient Visits          3 weeks ago Moderate single current episode of major depressive disorder (HCC)   [facility] [place], Megan P, DO   8 months ago Routine general medical examination at a health care facility   [facility], [place] P, DO   11 months ago Moderate single current episode of major depressive disorder [facility])   [facility] [place], Megan P, DO   2 years ago Routine general medical examination at a health care facility   [facility], [place] P, DO   2 years ago Moderate single current episode of major depressive disorder [facility])   [facility] Dorcas Carrow, DO      Future Appointments            In 4 days Laural Benes, Oralia Rud, DO [facility]   In 4 months Laural Benes, Oralia Rud, DO [facility]

## 2019-09-24 ENCOUNTER — Telehealth (INDEPENDENT_AMBULATORY_CARE_PROVIDER_SITE_OTHER): Payer: BC Managed Care – PPO | Admitting: Family Medicine

## 2019-09-24 ENCOUNTER — Encounter: Payer: Self-pay | Admitting: Family Medicine

## 2019-09-24 DIAGNOSIS — G43719 Chronic migraine without aura, intractable, without status migrainosus: Secondary | ICD-10-CM | POA: Diagnosis not present

## 2019-09-24 DIAGNOSIS — F321 Major depressive disorder, single episode, moderate: Secondary | ICD-10-CM

## 2019-09-24 MED ORDER — TOPIRAMATE 50 MG PO TABS: ORAL_TABLET | ORAL | 3 refills | Status: DC

## 2019-09-24 MED ORDER — LURASIDONE HCL 40 MG PO TABS: 40.0000 mg | ORAL_TABLET | Freq: Every day | ORAL | 3 refills | Status: DC

## 2019-09-24 MED ORDER — SUMATRIPTAN SUCCINATE 100 MG PO TABS: 100.0000 mg | ORAL_TABLET | ORAL | 12 refills | Status: DC | PRN

## 2019-09-24 NOTE — Assessment & Plan Note (Signed)
Not doing well. Will start her on topamax and imitrex and recheck 1 month. Call with any concerns.

## 2019-09-24 NOTE — Progress Notes (Signed)
There were no vitals taken for this visit.   Subjective:    Patient ID: Morgan Bush, female    DOB: [birth year], 19 y.o.   MRN: [id number]  HPI: Morgan Bush is a 19 y.o. female  Chief Complaint  Patient presents with  . Depression   DEPRESSION Mood status: exacerbated Satisfied with current treatment?: no Symptom severity: severe  Duration of current treatment : months Side effects: no Medication compliance: excellent compliance Psychotherapy/counseling: no  Previous psychiatric medications: lexapro, fluoxetine Depressed mood: yes Anxious mood: yes Anhedonia: no Significant weight loss or gain: no Insomnia: yes hard to fall asleep Fatigue: yes Feelings of worthlessness or guilt: yes Impaired concentration/indecisiveness: no Suicidal ideations: no Hopelessness: no Crying spells: yes Depression screen [facility] 2/9 09/24/2019 08/25/2019 01/23/2019 10/24/2018 09/17/2017  Decreased Interest 3 3 0 0 2  Down, Depressed, Hopeless 3 3 0 1 2  PHQ - 2 Score 6 6 0 1 4  Altered sleeping 3 3 3  0 1  Tired, decreased energy 3 2 3 3 1   Change in appetite 1 3 3  0 1  Feeling bad or failure about yourself  3 3 1  0 2  Trouble concentrating 3 3 2 3 3   Moving slowly or fidgety/restless 1 2 0 3 1  Suicidal thoughts 1 2 0 0 1  PHQ-9 Score 21 24 12 10 14   Difficult doing work/chores Very difficult Very difficult Not difficult at all Very difficult -  Some recent data might be hidden   GAD 7 : Generalized Anxiety Score 09/24/2019 01/23/2019 10/24/2018 09/17/2017  Nervous, Anxious, on Edge 3 2 3 3   Control/stop worrying 3 1 3 3   Worry too much - different things 3 1 3 3   Trouble relaxing 3 3 3 3   Restless 3 3 3 2   Easily annoyed or irritable 3 3 3 3   Afraid - awful might happen 3 2 2 1   Total GAD 7 Score 21 15 20 18   Anxiety Difficulty Very difficult Somewhat difficult Very difficult -   MIGRAINES- have been getting worse. Has been having them more often. Taking her nortripytline as acute  medicine, not really helping.  Duration: chronic Onset: sudden Severity: severe Quality: sharp and severe Frequency: intermittent Location: behind her eye Headache duration: hours to days Radiation: no Time of day headache occurs: at random Headache status at time of visit: asymptomatic Treatments attempted: rest, ice, heat, APAP, ibuprofen, aleve", excedrine and amitriptyline   Aura: no Nausea:  yes Vomiting: no Photophobia:  yes Phonophobia:  yes Effect on social functioning:  yes Confusion:  no Gait disturbance/ataxia:  no Behavioral changes:  no Fevers:  no   Relevant past medical, surgical, family and social history reviewed and updated as indicated. Interim medical history since our last visit reviewed. Allergies and medications reviewed and updated.  Review of Systems  Constitutional: Negative.   Respiratory: Negative.   Cardiovascular: Negative.   Gastrointestinal: Negative.   Musculoskeletal: Negative.   Skin: Negative.   Neurological: Positive for headaches. Negative for dizziness, tremors, seizures, syncope, facial asymmetry, speech difficulty, weakness, light-headedness and numbness.  Hematological: Negative.   Psychiatric/Behavioral: Positive for agitation, decreased concentration, dysphoric mood and sleep disturbance. Negative for behavioral problems, confusion, hallucinations, self-injury and suicidal ideas. The patient is nervous/anxious. The patient is not hyperactive.     Per HPI unless specifically indicated above     Objective:    There were no vitals taken for this visit.  Wt Readings from Last 3 Encounters:  01/23/19 151 lb (68.5 kg)  10/24/18 153 lb 4 oz (69.5 kg)  09/17/17 160 lb (72.6 kg) (88 %, Z= 1.18)*   * Growth percentiles are based on CDC (Girls, 2-20 Years) data.    Physical Exam Vitals and nursing note reviewed.  Constitutional:      General: She is not in acute distress.    Appearance: Normal appearance. She is not  ill-appearing, toxic-appearing or diaphoretic.  HENT:     Head: Normocephalic and atraumatic.     Right Ear: External ear normal.     Left Ear: External ear normal.     Nose: Nose normal.     Mouth/Throat:     Mouth: Mucous membranes are moist.     Pharynx: Oropharynx is clear.  Eyes:     General: No scleral icterus.       Right eye: No discharge.        Left eye: No discharge.     Conjunctiva/sclera: Conjunctivae normal.     Pupils: Pupils are equal, round, and reactive to light.  Pulmonary:     Effort: Pulmonary effort is normal. No respiratory distress.     Comments: Speaking in full sentences Musculoskeletal:        General: Normal range of motion.     Cervical back: Normal range of motion.  Skin:    Coloration: Skin is not jaundiced or pale.     Findings: No bruising, erythema, lesion or rash.  Neurological:     Mental Status: She is alert and oriented to person, place, and time. Mental status is at baseline.  Psychiatric:        Mood and Affect: Mood normal.        Behavior: Behavior normal.        Thought Content: Thought content normal.        Judgment: Judgment normal.     Results for orders placed or performed in visit on 01/23/19  GC/Chlamydia Probe Amp   Specimen: Urine   UR  Result Value Ref Range   Chlamydia trachomatis, NAA Negative Negative   Neisseria Gonorrhoeae by PCR Negative Negative  Microscopic Examination   URINE  Result Value Ref Range   WBC, UA 0-5 0 - 5 /hpf   RBC None seen 0 - 2 /hpf   Epithelial Cells (non renal) 0-10 0 - 10 /hpf   Bacteria, UA Moderate (A) None seen/Few  Urine Culture, Reflex   URINE  Result Value Ref Range   Urine Culture, Routine Final report (A)    Organism ID, Bacteria Escherichia coli (A)    Antimicrobial Susceptibility Comment   CBC with Differential/Platelet  Result Value Ref Range   WBC 4.0 3.4 - 10.8 x10E3/uL   RBC 4.35 3.77 - 5.28 x10E6/uL   Hemoglobin 12.7 11.1 - 15.9 g/dL   Hematocrit 76.8 11.5 -  46.6 %   MCV 87 79 - 97 fL   MCH 29.2 26.6 - 33.0 pg   MCHC 33.7 31.5 - 35.7 g/dL   RDW 72.6 20.3 - 55.9 %   Platelets 250 150 - 450 x10E3/uL   Neutrophils 33 Not Estab. %   Lymphs 53 Not Estab. %   Monocytes 9 Not Estab. %   Eos 3 Not Estab. %   Basos 2 Not Estab. %   Neutrophils Absolute 1.3 (L) 1.4 - 7.0 x10E3/uL   Lymphocytes Absolute 2.2 0.7 - 3.1 x10E3/uL   Monocytes Absolute 0.3 0.1 - 0.9 x10E3/uL   EOS (ABSOLUTE) 0.1 0.0 -  0.4 x10E3/uL   Basophils Absolute 0.1 0.0 - 0.2 x10E3/uL   Immature Granulocytes 0 Not Estab. %   Immature Grans (Abs) 0.0 0.0 - 0.1 x10E3/uL  Comprehensive metabolic panel  Result Value Ref Range   Glucose 80 65 - 99 mg/dL   BUN 7 6 - 20 mg/dL   Creatinine, Ser 1.43 0.57 - 1.00 mg/dL   GFR calc non Af Amer 92 >59 mL/min/1.73   GFR calc Af Amer 106 >59 mL/min/1.73   BUN/Creatinine Ratio 8 (L) 9 - 23   Sodium 140 134 - 144 mmol/L   Potassium 4.0 3.5 - 5.2 mmol/L   Chloride 101 96 - 106 mmol/L   CO2 23 20 - 29 mmol/L   Calcium 9.4 8.7 - 10.2 mg/dL   Total Protein 7.1 6.0 - 8.5 g/dL   Albumin 4.4 3.9 - 5.0 g/dL   Globulin, Total 2.7 1.5 - 4.5 g/dL   Albumin/Globulin Ratio 1.6 1.2 - 2.2   Bilirubin Total 0.5 0.0 - 1.2 mg/dL   Alkaline Phosphatase 65 39 - 117 IU/L   AST 15 0 - 40 IU/L   ALT 9 0 - 32 IU/L  Lipid Panel w/o Chol/HDL Ratio  Result Value Ref Range   Cholesterol, Total 187 100 - 199 mg/dL   Triglycerides 888 0 - 149 mg/dL   HDL 72 >75 mg/dL   VLDL Cholesterol Cal 26 5 - 40 mg/dL   LDL Calculated 89 0 - 99 mg/dL  TSH  Result Value Ref Range   TSH 2.380 0.450 - 4.500 uIU/mL  UA/M w/rflx Culture, Routine   Specimen: Urine   URINE  Result Value Ref Range   Specific Gravity, UA 1.025 1.005 - 1.030   pH, UA 5.5 5.0 - 7.5   Color, UA Yellow Yellow   Appearance Ur Cloudy (A) Clear   Leukocytes,UA Trace (A) Negative   Protein,UA Negative Negative/Trace   Glucose, UA Negative Negative   Ketones, UA Negative Negative   RBC, UA  Negative Negative   Bilirubin, UA Negative Negative   Urobilinogen, Ur 1.0 0.2 - 1.0 mg/dL   Nitrite, UA Negative Negative   Microscopic Examination See below:    Urinalysis Reflex Comment       Assessment & Plan:   Problem List Items Addressed This Visit      Cardiovascular and Mediastinum   Intractable chronic migraine without aura and without status migrainosus    Not doing well. Will start her on topamax and imitrex and recheck 1 month. Call with any concerns.       Relevant Medications   nortriptyline (PAMELOR) 75 MG capsule   SUMAtriptan (IMITREX) 100 MG tablet   topiramate (TOPAMAX) 50 MG tablet     Other   Moderate single current episode of major depressive disorder (HCC)    Doing significantly worse. Strong family history of bipolar. Will try switching her to latuda and recheck 2 weeks. Call with any concerns.       Relevant Medications   nortriptyline (PAMELOR) 75 MG capsule       Follow up plan: Return in about 2 weeks (around 10/08/2019).    . This visit was completed via MyChart due to the restrictions of the COVID-19 pandemic. All issues as above were discussed and addressed. Physical exam was done as above through visual confirmation on MyChart. If it was felt that the patient should be evaluated in the office, they were directed there. The patient verbally consented to this visit. . Location of the  patient: home . Location of the provider: home . Those involved with this call:  . Provider: Park Liter, DO . CMA: Tiffany Reel, CMA . Front Desk/Registration: Don Perking  . Time spent on call: 25 minutes with patient face to face via video conference. More than 50% of this time was spent in counseling and coordination of care. 40 minutes total spent in review of patient's record and preparation of their chart.

## 2019-09-24 NOTE — Assessment & Plan Note (Signed)
Doing significantly worse. Strong family history of bipolar. Will try switching her to latuda and recheck 2 weeks. Call with any concerns.

## 2019-09-25 ENCOUNTER — Other Ambulatory Visit: Payer: Self-pay | Admitting: Family Medicine

## 2019-09-25 ENCOUNTER — Telehealth: Payer: Self-pay

## 2019-09-25 MED ORDER — TOPIRAMATE 50 MG PO TABS: ORAL_TABLET | ORAL | 3 refills | Status: DC

## 2019-09-25 MED ORDER — SUMATRIPTAN SUCCINATE 100 MG PO TABS: 100.0000 mg | ORAL_TABLET | ORAL | 12 refills | Status: DC | PRN

## 2019-09-25 NOTE — Telephone Encounter (Signed)
FYI I did a PA on patient's Latuda this morning.

## 2019-09-25 NOTE — Telephone Encounter (Signed)
Prior Authorization initiated via CoverMyMeds for Latuda 40MG  tablets Key: [facility]

## 2019-09-26 NOTE — Telephone Encounter (Signed)
PA denied. We should receive a fax as to reasoning.  Routing to provider to advise.

## 2019-09-29 MED ORDER — ARIPIPRAZOLE 5 MG PO TABS: 5.0000 mg | ORAL_TABLET | Freq: Every day | ORAL | 3 refills | Status: DC

## 2019-09-29 NOTE — Telephone Encounter (Signed)
Called and LVM for patient asking for her to please return my call.  

## 2019-09-29 NOTE — Telephone Encounter (Signed)
Won't pay for latuda. Will try abilify- let us know if it makes her feel funny at all.

## 2019-09-30 NOTE — Telephone Encounter (Signed)
Patient notified

## 2019-09-30 NOTE — Telephone Encounter (Signed)
Called patient, no answer, left a message asking patient to return my call.   

## 2019-10-08 ENCOUNTER — Telehealth (INDEPENDENT_AMBULATORY_CARE_PROVIDER_SITE_OTHER): Payer: BC Managed Care – PPO | Admitting: Family Medicine

## 2019-10-08 ENCOUNTER — Encounter: Payer: Self-pay | Admitting: Family Medicine

## 2019-10-08 ENCOUNTER — Telehealth: Payer: Self-pay | Admitting: Family Medicine

## 2019-10-08 DIAGNOSIS — G43719 Chronic migraine without aura, intractable, without status migrainosus: Secondary | ICD-10-CM | POA: Diagnosis not present

## 2019-10-08 DIAGNOSIS — F321 Major depressive disorder, single episode, moderate: Secondary | ICD-10-CM

## 2019-10-08 NOTE — Assessment & Plan Note (Signed)
Has not been able to pick up her abilify yet. Will pick it [place] and try it. Check in in a couple of weeks. Call with any concerns.

## 2019-10-08 NOTE — Assessment & Plan Note (Signed)
Will cut down on her topamax to 25mg  until abdominal discomfort resolves. If not resolving, let know.

## 2019-10-08 NOTE — Progress Notes (Signed)
There were no vitals taken for this visit.   Subjective:    Patient ID: Morgan Bush, female    DOB: 2000/09/27, 19 y.o.   MRN: [id number]  HPI: Morgan Bush is a 19 y.o. female  Chief Complaint  Patient presents with  . Depression   DEPRESSION- tried to get her on latuda, not covered. Has not been able to start her abilify- has been more of the up side Mood status: stable Satisfied with current treatment?: no Symptom severity: moderate  Duration of current treatment : weeks Side effects: no Medication compliance: excellent compliance Psychotherapy/counseling: no  Previous psychiatric medications: prozac, lexapro,  Depressed mood: yes Anxious mood: yes Anhedonia: no Significant weight loss or gain: no Insomnia: no  Fatigue: yes Feelings of worthlessness or guilt: yes Impaired concentration/indecisiveness: yes Suicidal ideations: no Hopelessness: no Crying spells: no Depression screen [facility] 2/9 10/08/2019 09/24/2019 08/25/2019 01/23/2019 10/24/2018  Decreased Interest 2 3 3  0 0  Down, Depressed, Hopeless 2 3 3  0 1  PHQ - 2 Score 4 6 6  0 1  Altered sleeping 3 3 3 3  0  Tired, decreased energy 3 3 2 3 3   Change in appetite 3 1 3 3  0  Feeling bad or failure about yourself  2 3 3 1  0  Trouble concentrating 3 3 3 2 3   Moving slowly or fidgety/restless 2 1 2  0 3  Suicidal thoughts 1 1 2  0 0  PHQ-9 Score 21 21 24 12 10   Difficult doing work/chores Very difficult Very difficult Very difficult Not difficult at all Very difficult  Some recent data might be hidden   GAD 7 : Generalized Anxiety Score 10/08/2019 09/24/2019 01/23/2019 10/24/2018  Nervous, Anxious, on Edge 3 3 2 3   Control/stop worrying 3 3 1 3   Worry too much - different things 3 3 1 3   Trouble relaxing 3 3 3 3   Restless 3 3 3 3   Easily annoyed or irritable 3 3 3 3   Afraid - awful might happen 3 3 2 2   Total GAD 7 Score 21 21 15 20   Anxiety Difficulty Extremely difficult Very difficult Somewhat difficult Very  difficult   Doing OK with the topamax, but having some stomach aches with it. Migraines seem to be doing well.    Relevant past medical, surgical, family and social history reviewed and updated as indicated. Interim medical history since our last visit reviewed. Allergies and medications reviewed and updated.  Review of Systems  Constitutional: Negative.   Respiratory: Negative.   Cardiovascular: Negative.   Musculoskeletal: Negative.   Psychiatric/Behavioral: Positive for dysphoric mood. Negative for agitation, behavioral problems, confusion, decreased concentration, hallucinations, self-injury, sleep disturbance and suicidal ideas. The patient is nervous/anxious. The patient is not hyperactive.     Per HPI unless specifically indicated above     Objective:    There were no vitals taken for this visit.  Wt Readings from Last 3 Encounters:  01/23/19 151 lb (68.5 kg)  10/24/18 153 lb 4 oz (69.5 kg)  09/17/17 160 lb (72.6 kg) (88 %, Z= 1.18)*   * Growth percentiles are based on CDC (Girls, 2-20 Years) data.    Physical Exam Vitals and nursing note reviewed.  Constitutional:      General: She is not in acute distress.    Appearance: Normal appearance. She is not ill-appearing, toxic-appearing or diaphoretic.  HENT:     Head: Normocephalic and atraumatic.     Right Ear: External ear normal.  Left Ear: External ear normal.     Nose: Nose normal.     Mouth/Throat:     Mouth: Mucous membranes are moist.     Pharynx: Oropharynx is clear.  Eyes:     General: No scleral icterus.       Right eye: No discharge.        Left eye: No discharge.     Conjunctiva/sclera: Conjunctivae normal.     Pupils: Pupils are equal, round, and reactive to light.  Pulmonary:     Effort: Pulmonary effort is normal. No respiratory distress.     Comments: Speaking in full sentences Musculoskeletal:        General: Normal range of motion.     Cervical back: Normal range of motion.  Skin:     Coloration: Skin is not jaundiced or pale.     Findings: No bruising, erythema, lesion or rash.  Neurological:     Mental Status: She is alert and oriented to person, place, and time. Mental status is at baseline.  Psychiatric:        Mood and Affect: Mood normal.        Behavior: Behavior normal.        Thought Content: Thought content normal.        Judgment: Judgment normal.     Results for orders placed or performed in visit on 01/23/19  GC/Chlamydia Probe Amp   Specimen: Urine   UR  Result Value Ref Range   Chlamydia trachomatis, NAA Negative Negative   Neisseria Gonorrhoeae by PCR Negative Negative  Microscopic Examination   URINE  Result Value Ref Range   WBC, UA 0-5 0 - 5 /hpf   RBC None seen 0 - 2 /hpf   Epithelial Cells (non renal) 0-10 0 - 10 /hpf   Bacteria, UA Moderate (A) None seen/Few  Urine Culture, Reflex   URINE  Result Value Ref Range   Urine Culture, Routine Final report (A)    Organism ID, Bacteria Escherichia coli (A)    Antimicrobial Susceptibility Comment   CBC with Differential/Platelet  Result Value Ref Range   WBC 4.0 3.4 - 10.8 x10E3/uL   RBC 4.35 3.77 - 5.28 x10E6/uL   Hemoglobin 12.7 11.1 - 15.9 g/dL   Hematocrit 37.7 34.0 - 46.6 %   MCV 87 79 - 97 fL   MCH 29.2 26.6 - 33.0 pg   MCHC 33.7 31.5 - 35.7 g/dL   RDW 12.8 11.7 - 15.4 %   Platelets 250 150 - 450 x10E3/uL   Neutrophils 33 Not Estab. %   Lymphs 53 Not Estab. %   Monocytes 9 Not Estab. %   Eos 3 Not Estab. %   Basos 2 Not Estab. %   Neutrophils Absolute 1.3 (L) 1.4 - 7.0 x10E3/uL   Lymphocytes Absolute 2.2 0.7 - 3.1 x10E3/uL   Monocytes Absolute 0.3 0.1 - 0.9 x10E3/uL   EOS (ABSOLUTE) 0.1 0.0 - 0.4 x10E3/uL   Basophils Absolute 0.1 0.0 - 0.2 x10E3/uL   Immature Granulocytes 0 Not Estab. %   Immature Grans (Abs) 0.0 0.0 - 0.1 x10E3/uL  Comprehensive metabolic panel  Result Value Ref Range   Glucose 80 65 - 99 mg/dL   BUN 7 6 - 20 mg/dL   Creatinine, Ser 0.90 0.57 - 1.00  mg/dL   GFR calc non Af Amer 92 >59 mL/min/1.73   GFR calc Af Amer 106 >59 mL/min/1.73   BUN/Creatinine Ratio 8 (L) 9 - 23   Sodium 140  134 - 144 mmol/L   Potassium 4.0 3.5 - 5.2 mmol/L   Chloride 101 96 - 106 mmol/L   CO2 23 20 - 29 mmol/L   Calcium 9.4 8.7 - 10.2 mg/dL   Total Protein 7.1 6.0 - 8.5 g/dL   Albumin 4.4 3.9 - 5.0 g/dL   Globulin, Total 2.7 1.5 - 4.5 g/dL   Albumin/Globulin Ratio 1.6 1.2 - 2.2   Bilirubin Total 0.5 0.0 - 1.2 mg/dL   Alkaline Phosphatase 65 39 - 117 IU/L   AST 15 0 - 40 IU/L   ALT 9 0 - 32 IU/L  Lipid Panel w/o Chol/HDL Ratio  Result Value Ref Range   Cholesterol, Total 187 100 - 199 mg/dL   Triglycerides 811 0 - 149 mg/dL   HDL 72 >91 mg/dL   VLDL Cholesterol Cal 26 5 - 40 mg/dL   LDL Calculated 89 0 - 99 mg/dL  TSH  Result Value Ref Range   TSH 2.380 0.450 - 4.500 uIU/mL  UA/M w/rflx Culture, Routine   Specimen: Urine   URINE  Result Value Ref Range   Specific Gravity, UA 1.025 1.005 - 1.030   pH, UA 5.5 5.0 - 7.5   Color, UA Yellow Yellow   Appearance Ur Cloudy (A) Clear   Leukocytes,UA Trace (A) Negative   Protein,UA Negative Negative/Trace   Glucose, UA Negative Negative   Ketones, UA Negative Negative   RBC, UA Negative Negative   Bilirubin, UA Negative Negative   Urobilinogen, Ur 1.0 0.2 - 1.0 mg/dL   Nitrite, UA Negative Negative   Microscopic Examination See below:    Urinalysis Reflex Comment       Assessment & Plan:   Problem List Items Addressed This Visit      Cardiovascular and Mediastinum   Intractable chronic migraine without aura and without status migrainosus    Will cut down on her topamax to 25mg  until abdominal discomfort resolves. If not resolving, let know.         Other   Moderate single current episode of major depressive disorder (HCC)    Has not been able to pick up her abilify yet. Will pick it [place] and try it. Check in in a couple of weeks. Call with any concerns.           Follow up  plan: Return 3-4 weeks, for follow up mood, virtual.   . This visit was completed via MyChart due to the restrictions of the COVID-19 pandemic. All issues as above were discussed and addressed. Physical exam was done as above through visual confirmation on MyChart. If it was felt that the patient should be evaluated in the office, they were directed there. The patient verbally consented to this visit. . Location of the patient: home . Location of the provider: home . Those involved with this call:  . Provider: [place], DO . CMA: Tiffany Reel, CMA . Front Desk/Registration: Olevia Perches  . Time spent on call: 15 minutes with patient face to face via video conference. More than 50% of this time was spent in counseling and coordination of care. 23 minutes total spent in review of patient's record and preparation of their chart.

## 2019-10-08 NOTE — Telephone Encounter (Signed)
-----   Message from Dorcas Carrow, DO sent at 10/08/2019  8:22 AM EDT ----- Virtual 3-4 weeks mood

## 2019-10-08 NOTE — Telephone Encounter (Signed)
Lvm,sent letter.  

## 2019-10-08 NOTE — Telephone Encounter (Signed)
-----   Message from Megan P Johnson, DO sent at 10/08/2019  8:22 AM EDT ----- Virtual 3-4 weeks mood 

## 2019-10-17 DIAGNOSIS — Z1152 Encounter for screening for COVID-19: Secondary | ICD-10-CM | POA: Diagnosis not present

## 2019-10-17 DIAGNOSIS — R110 Nausea: Secondary | ICD-10-CM | POA: Diagnosis not present

## 2019-10-21 ENCOUNTER — Encounter: Payer: Self-pay | Admitting: Family Medicine

## 2019-10-21 DIAGNOSIS — F321 Major depressive disorder, single episode, moderate: Secondary | ICD-10-CM

## 2019-12-29 ENCOUNTER — Other Ambulatory Visit: Payer: Self-pay | Admitting: Family Medicine

## 2020-01-26 ENCOUNTER — Encounter: Payer: Self-pay | Admitting: Family Medicine

## 2020-01-27 ENCOUNTER — Encounter: Payer: BC Managed Care – PPO | Admitting: Family Medicine

## 2020-02-24 DIAGNOSIS — R519 Headache, unspecified: Secondary | ICD-10-CM | POA: Diagnosis not present

## 2020-02-24 DIAGNOSIS — Z20828 Contact with and (suspected) exposure to other viral communicable diseases: Secondary | ICD-10-CM | POA: Diagnosis not present

## 2020-02-24 DIAGNOSIS — R0981 Nasal congestion: Secondary | ICD-10-CM | POA: Diagnosis not present

## 2020-03-12 ENCOUNTER — Encounter: Payer: BC Managed Care – PPO | Admitting: Family Medicine

## 2020-03-22 ENCOUNTER — Other Ambulatory Visit: Payer: Self-pay | Admitting: Family Medicine

## 2020-04-28 ENCOUNTER — Encounter: Payer: Self-pay | Admitting: Family Medicine

## 2020-05-07 DIAGNOSIS — R079 Chest pain, unspecified: Secondary | ICD-10-CM | POA: Diagnosis not present

## 2020-05-07 DIAGNOSIS — R519 Headache, unspecified: Secondary | ICD-10-CM | POA: Diagnosis not present

## 2020-05-07 DIAGNOSIS — R5383 Other fatigue: Secondary | ICD-10-CM | POA: Diagnosis not present

## 2020-05-07 DIAGNOSIS — Z1159 Encounter for screening for other viral diseases: Secondary | ICD-10-CM | POA: Diagnosis not present

## 2020-06-24 NOTE — Progress Notes (Signed)
PCP:  Dorcas Carrow, DO   Chief Complaint  Patient presents with  . Gynecologic Exam    No concerns  . Contraception    Has qs on IUD     HPI:      Ms. Morgan Bush is a 19 y.o. No obstetric history on file. whose LMP was Patient's last menstrual period was 06/13/2020 (exact date)., presents today for her NP annual examination.  Her menses are regular every 28-30 days, lasting 3-5 days.  Dysmenorrhea none. She does not have intermenstrual bleeding.  Sex activity: single partner, contraception - OCP (estrogen/progesterone).  Last Pap: never due to age Hx of STDs: none  There is no FH of breast cancer. There is no FH of ovarian cancer. The patient does do self-breast exams.  Tobacco use: The patient denies current or previous tobacco use. Alcohol use: social drinker No drug use.  Exercise: moderately active  She does get adequate calcium but not Vitamin D in her diet. Gardasil completed.   Upstream - 06/28/20 2025      Pregnancy Intention Screening   Does the patient want to become pregnant in the next year? No    Does the patient's partner want to become pregnant in the next year? No    Would the patient like to discuss contraceptive options today? Yes      Contraception Wrap Up   Current Method Oral Contraceptive          The pregnancy intention screening data noted above was reviewed. Potential methods of contraception were discussed. The patient elected to proceed with Oral Contraceptive.     Past Medical History:  Diagnosis Date  . Acne   . Asthma   . Concussion 07/2013  . Migraine   . Migraine headache     History reviewed. No pertinent surgical history.  Family History  Problem Relation Age of Onset  . Mental illness Mother        Bipolar  . Migraines Mother   . Kidney Stones Father     Social History   Socioeconomic History  . Marital status: Single    Spouse name: Not on file  . Number of children: Not on file  . Years of  education: Not on file  . Highest education level: Not on file  Occupational History  . Not on file  Tobacco Use  . Smoking status: Never Smoker  . Smokeless tobacco: Never Used  Vaping Use  . Vaping Use: Never used  Substance and Sexual Activity  . Alcohol use: No  . Drug use: No  . Sexual activity: Yes    Birth control/protection: Pill, Condom  Other Topics Concern  . Not on file  Social History Narrative  . Not on file   Social Determinants of Health   Financial Resource Strain: Not on file  Food Insecurity: Not on file  Transportation Needs: Not on file  Physical Activity: Not on file  Stress: Not on file  Social Connections: Not on file  Intimate Partner Violence: Not on file     Current Outpatient Medications:  .  Norgestimate-Ethinyl Estradiol Triphasic (TRI-SPRINTEC) 0.18/0.215/0.25 MG-35 MCG tablet, Take 1 tablet by mouth daily., Disp: 84 tablet, Rfl: 3 .  nortriptyline (PAMELOR) 75 MG capsule, Take 75 mg by mouth at bedtime. (Patient not taking: Reported on 06/28/2020), Disp: , Rfl:      ROS:  Review of Systems  Constitutional: Negative for fatigue, fever and unexpected weight change.  Respiratory: Negative for cough,  shortness of breath and wheezing.   Cardiovascular: Negative for chest pain, palpitations and leg swelling.  Gastrointestinal: Negative for blood in stool, constipation, diarrhea, nausea and vomiting.  Endocrine: Negative for cold intolerance, heat intolerance and polyuria.  Genitourinary: Negative for dyspareunia, dysuria, flank pain, frequency, genital sores, hematuria, menstrual problem, pelvic pain, urgency, vaginal bleeding, vaginal discharge and vaginal pain.  Musculoskeletal: Negative for back pain, joint swelling and myalgias.  Skin: Negative for rash.  Neurological: Negative for dizziness, syncope, light-headedness, numbness and headaches.  Hematological: Negative for adenopathy.  Psychiatric/Behavioral: Negative for agitation,  confusion, sleep disturbance and suicidal ideas. The patient is not nervous/anxious.   BREAST: No symptoms   Objective: BP 120/70   Ht 5\' 10"  (1.778 m)   Wt 157 lb (71.2 kg)   LMP 06/13/2020 (Exact Date)   BMI 22.53 kg/m    Physical Exam Constitutional:      Appearance: She is well-developed.  Genitourinary:     Vulva normal.     Right Labia: No rash, tenderness or lesions.    Left Labia: No tenderness, lesions or rash.    No vaginal discharge, erythema or tenderness.      Right Adnexa: not tender and no mass present.    Left Adnexa: not tender and no mass present.    No cervical friability or polyp.     Uterus is not enlarged or tender.  Breasts:     Right: No mass, nipple discharge, skin change or tenderness.     Left: No mass, nipple discharge, skin change or tenderness.    Neck:     Thyroid: No thyromegaly.  Cardiovascular:     Rate and Rhythm: Normal rate and regular rhythm.     Heart sounds: Normal heart sounds. No murmur heard.   Pulmonary:     Effort: Pulmonary effort is normal.     Breath sounds: Normal breath sounds.  Abdominal:     Palpations: Abdomen is soft.     Tenderness: There is no abdominal tenderness. There is no guarding or rebound.  Musculoskeletal:        General: Normal range of motion.     Cervical back: Normal range of motion.  Lymphadenopathy:     Cervical: No cervical adenopathy.  Neurological:     General: No focal deficit present.     Mental Status: She is alert and oriented to person, place, and time.     Cranial Nerves: No cranial nerve deficit.  Skin:    General: Skin is warm and dry.  Psychiatric:        Mood and Affect: Mood normal.        Behavior: Behavior normal.        Thought Content: Thought content normal.        Judgment: Judgment normal.  Vitals reviewed.     Assessment/Plan: Encounter for annual routine gynecological examination  Cervical cancer screening - Plan: Cytology - PAP  Screening for STD  (sexually transmitted disease) - Plan: Cytology - PAP  Encounter for surveillance of contraceptive pills - Plan: Norgestimate-Ethinyl Estradiol Triphasic (TRI-SPRINTEC) 0.18/0.215/0.25 MG-35 MCG tablet; OCP RF  Meds ordered this encounter  Medications  . Norgestimate-Ethinyl Estradiol Triphasic (TRI-SPRINTEC) 0.18/0.215/0.25 MG-35 MCG tablet    Sig: Take 1 tablet by mouth daily.    Dispense:  84 tablet    Refill:  3    Order Specific Question:   Supervising Provider    Answer:   [birth year]1981 Nadara Mustard  GYN counsel adequate intake of calcium and vitamin D, diet and exercise     F/U  Return in about 1 year (around 06/28/2021).  Shizue Kaseman B. Feliz Herard, PA-C 06/28/2020 8:44 AM

## 2020-06-28 ENCOUNTER — Encounter: Payer: Self-pay | Admitting: Obstetrics and Gynecology

## 2020-06-28 ENCOUNTER — Ambulatory Visit (INDEPENDENT_AMBULATORY_CARE_PROVIDER_SITE_OTHER): Payer: BC Managed Care – PPO | Admitting: Obstetrics and Gynecology

## 2020-06-28 ENCOUNTER — Other Ambulatory Visit: Payer: Self-pay

## 2020-06-28 ENCOUNTER — Other Ambulatory Visit (HOSPITAL_COMMUNITY)
Admission: RE | Admit: 2020-06-28 | Discharge: 2020-06-28 | Disposition: A | Discharge status: Home / Self Care | Payer: BC Managed Care – PPO | Source: Ambulatory Visit | Attending: Obstetrics and Gynecology | Admitting: Obstetrics and Gynecology

## 2020-06-28 VITALS — BP 120/70 | Ht 70.0 in | Wt 157.0 lb

## 2020-06-28 DIAGNOSIS — Z124 Encounter for screening for malignant neoplasm of cervix: Secondary | ICD-10-CM

## 2020-06-28 DIAGNOSIS — Z01419 Encounter for gynecological examination (general) (routine) without abnormal findings: Secondary | ICD-10-CM

## 2020-06-28 DIAGNOSIS — Z113 Encounter for screening for infections with a predominantly sexual mode of transmission: Secondary | ICD-10-CM | POA: Insufficient documentation

## 2020-06-28 DIAGNOSIS — R87612 Low grade squamous intraepithelial lesion on cytologic smear of cervix (LGSIL): Secondary | ICD-10-CM | POA: Diagnosis not present

## 2020-06-28 DIAGNOSIS — Z3041 Encounter for surveillance of contraceptive pills: Secondary | ICD-10-CM

## 2020-06-28 MED ORDER — NORGESTIM-ETH ESTRAD TRIPHASIC 0.18/0.215/0.25 MG-35 MCG PO TABS: 1.0000 | ORAL_TABLET | Freq: Every day | ORAL | 3 refills | Status: AC

## 2020-06-28 NOTE — Patient Instructions (Signed)
I value your feedback and entrusting us with your care. If you get a [facility] patient survey, I would appreciate you taking the time to let us know about your experience today. Thank you!  As of June 19, 2019, your lab results will be released to your MyChart immediately, before I even have a chance to see them. Please give me time to review them and contact you if there are any abnormalities. Thank you for your patience.  

## 2020-06-29 LAB — CYTOLOGY - PAP
Chlamydia: NEGATIVE
Comment: NEGATIVE
Comment: NORMAL
Neisseria Gonorrhea: NEGATIVE

## 2020-06-30 ENCOUNTER — Encounter: Payer: Self-pay | Admitting: Obstetrics and Gynecology

## 2020-06-30 MED ORDER — FLUCONAZOLE 150 MG PO TABS: 150.0000 mg | ORAL_TABLET | Freq: Once | ORAL | 0 refills | Status: AC

## 2020-06-30 NOTE — Addendum Note (Signed)
Addended by: Althea Grimmer B on: 06/30/2020 04:21 PM   Modules accepted: Orders

## 2020-08-09 DIAGNOSIS — Z20822 Contact with and (suspected) exposure to covid-19: Secondary | ICD-10-CM | POA: Diagnosis not present

## 2020-10-28 ENCOUNTER — Encounter: Payer: Self-pay | Admitting: Obstetrics and Gynecology

## 2020-10-29 DIAGNOSIS — N390 Urinary tract infection, site not specified: Secondary | ICD-10-CM | POA: Diagnosis not present

## 2020-10-29 DIAGNOSIS — Z113 Encounter for screening for infections with a predominantly sexual mode of transmission: Secondary | ICD-10-CM | POA: Diagnosis not present

## 2020-10-29 DIAGNOSIS — N760 Acute vaginitis: Secondary | ICD-10-CM | POA: Diagnosis not present
